# Patient Record
Sex: Female | Born: 1980
Health system: Southern US, Community
[De-identification: ages and names within clinical notes are randomized; demographics above are authoritative.]

---
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
## 2002-10-17 ENCOUNTER — Emergency Department (HOSPITAL_COMMUNITY): Admission: EM | Admit: 2002-10-17 | Discharge: 2002-10-18 | Payer: Self-pay | Admitting: Emergency Medicine

## 2002-10-18 ENCOUNTER — Encounter: Payer: Self-pay | Admitting: Emergency Medicine

## 2003-09-19 ENCOUNTER — Other Ambulatory Visit: Admission: RE | Admit: 2003-09-19 | Discharge: 2003-09-19 | Payer: Self-pay | Admitting: Obstetrics and Gynecology

## 2004-10-30 ENCOUNTER — Other Ambulatory Visit: Admission: RE | Admit: 2004-10-30 | Discharge: 2004-10-30 | Payer: Self-pay | Admitting: Obstetrics and Gynecology

## 2004-11-26 ENCOUNTER — Emergency Department (HOSPITAL_COMMUNITY): Admission: EM | Admit: 2004-11-26 | Discharge: 2004-11-27 | Payer: Self-pay | Admitting: *Deleted

## 2005-02-19 ENCOUNTER — Emergency Department (HOSPITAL_COMMUNITY): Admission: EM | Admit: 2005-02-19 | Discharge: 2005-02-19 | Payer: Self-pay | Admitting: Family Medicine

## 2005-03-05 ENCOUNTER — Emergency Department (HOSPITAL_COMMUNITY): Admission: EM | Admit: 2005-03-05 | Discharge: 2005-03-05 | Payer: Self-pay | Admitting: Emergency Medicine

## 2006-08-16 ENCOUNTER — Emergency Department (HOSPITAL_COMMUNITY): Admission: EM | Admit: 2006-08-16 | Discharge: 2006-08-16 | Payer: Self-pay | Admitting: Emergency Medicine

## 2007-02-19 ENCOUNTER — Emergency Department (HOSPITAL_COMMUNITY): Admission: EM | Admit: 2007-02-19 | Discharge: 2007-02-19 | Payer: Self-pay | Admitting: Emergency Medicine

## 2007-08-25 ENCOUNTER — Emergency Department (HOSPITAL_COMMUNITY): Admission: EM | Admit: 2007-08-25 | Discharge: 2007-08-26 | Payer: Self-pay | Admitting: Emergency Medicine

## 2007-08-28 ENCOUNTER — Inpatient Hospital Stay (HOSPITAL_COMMUNITY): Admission: AD | Admit: 2007-08-28 | Discharge: 2007-08-28 | Payer: Self-pay | Admitting: Obstetrics and Gynecology

## 2008-03-14 ENCOUNTER — Emergency Department (HOSPITAL_COMMUNITY): Admission: EM | Admit: 2008-03-14 | Discharge: 2008-03-14 | Payer: Self-pay | Admitting: Family Medicine

## 2009-08-14 ENCOUNTER — Inpatient Hospital Stay (HOSPITAL_COMMUNITY): Admission: AD | Admit: 2009-08-14 | Discharge: 2009-08-14 | Payer: Self-pay | Admitting: Obstetrics & Gynecology

## 2009-08-14 ENCOUNTER — Ambulatory Visit: Payer: Self-pay | Admitting: Nurse Practitioner

## 2009-08-21 ENCOUNTER — Emergency Department (HOSPITAL_COMMUNITY): Admission: EM | Admit: 2009-08-21 | Discharge: 2009-08-21 | Payer: Self-pay | Admitting: Family Medicine

## 2010-04-17 ENCOUNTER — Inpatient Hospital Stay (HOSPITAL_COMMUNITY)
Admission: AD | Admit: 2010-04-17 | Discharge: 2010-04-22 | DRG: 766 | Disposition: A | Discharge status: Home / Self Care | Payer: Medicaid Other | Source: Ambulatory Visit | Attending: Obstetrics and Gynecology | Admitting: Obstetrics and Gynecology

## 2010-04-17 DIAGNOSIS — O99892 Other specified diseases and conditions complicating childbirth: Secondary | ICD-10-CM | POA: Diagnosis present

## 2010-04-17 DIAGNOSIS — Z22330 Carrier of Group B streptococcus: Secondary | ICD-10-CM

## 2010-04-17 LAB — CBC
HCT: 35.7 % — ABNORMAL LOW (ref 36.0–46.0)
Hemoglobin: 12.1 g/dL (ref 12.0–15.0)
Platelets: 249 10*3/uL (ref 150–400)

## 2010-04-18 LAB — RPR: RPR Ser Ql: NONREACTIVE

## 2010-04-19 ENCOUNTER — Other Ambulatory Visit: Payer: Self-pay | Admitting: Obstetrics and Gynecology

## 2010-04-20 LAB — CBC: MCV: 64.6 fL — ABNORMAL LOW (ref 78.0–100.0)

## 2010-04-24 NOTE — Discharge Summary (Signed)
  Jodi Blair, Jodi Blair                 ACCOUNT NO.:  [id number]  MEDICAL RECORD NO.:  [id number]           PATIENT TYPE:  I  LOCATION:  9107                          [facility]AC[facility]:  [facility]  PHYSICIAN:  Sherron Monday, MD        DATE OF BIRTH:  11/21/1980  DATE OF ADMISSION:  04/18/2010 DATE OF DISCHARGE:  04/22/2010                              DISCHARGE SUMMARY   ADMITTING DIAGNOSIS:  Intrauterine pregnancy at 41+ weeks for induction of labor.  She is a G1, P0 with an EDC of April 09, 2010, by ultrasound admitted for induction.  She is dated by an early second trimester ultrasound. Her prenatal care was uncomplicated except for she was admitted the evening of the 6th for Cervidil for induction.  PAST MEDICAL HISTORY:  Significant for Chlamydia trach and condyloma as well as asthma.  PAST SURGICAL HISTORY:  Not significant.  PAST OBSTETRIC/GYNECOLOGICAL HISTORY:  G1 with present pregnancy.  MEDICATIONS:  Prenatal vitamins as well as clobetasol saline nasal spray, Tums, Benadryl, prenatal care, and iron.  ALLERGIES:  No known drug allergies.  SOCIAL HISTORY:  Denies alcohol, tobacco, or drug use.  FAMILY HISTORY:  Significant for HTN in her mother and father.  On the morning after admission, Cervidil was removed.  AROM was performed for clear fluid.  She progressed slowly in labor changing to approximately 2-3 cm.  This was unchanged over several hours with adequate contractions.  At this time, the discussion was made with the patient of failure to progress.  She voiced understanding and they proceeded.  She delivered a female infant weighing 8 pounds and 3 ounces with Apgars of 7 at 1 minute and 9 at 5 minutes.  Her postpartum care was relatively uncomplicated.  She remained afebrile.  Vital signs stable throughout.  Her hemoglobin decreased from 12.1-8.7.  She was discharged to home on postpartum day #3 with no complaints except her abdomen was sore and incision was itching.  She  tolerated p.o. well. She had normal lochia and was ambulating, voiding without difficulty. Her staples were removed and Steri-Strips were applied.  Incision was clean, dry, and intact.  She was discharged home with Motrin, iron, Percocet, and Phenergan at her request and prenatal vitamins with instructions to follow up in the office in approximately 2 weeks.  She voiced understanding of this and was discharged home.     Sherron Monday, MD     JB/MEDQ  D:  04/22/2010  T:  04/23/2010  Job:  [id number]  Electronically Signed by Sherron Monday MD on 04/24/2010 01:51:16 PM

## 2010-04-29 LAB — POCT PREGNANCY, URINE: Preg Test, Ur: POSITIVE

## 2010-04-30 NOTE — H&P (Signed)
Jodi Blair, Jodi Blair                 ACCOUNT NO.:  [id number]  MEDICAL RECORD NO.:  [id number]           PATIENT TYPE:  I  LOCATION:  9107                          [facility]  PHYSICIAN:  Malachi Pro. Ambrose Mantle, M.D. DATE OF BIRTH:  07-11-1980  DATE OF ADMISSION:  04/17/2010 DATE OF DISCHARGE:                             HISTORY & PHYSICAL   HISTORY OF PRESENT ILLNESS:  This is a 30 year old Asian female para 0, gravida 1, EDC April 09, 2010, by ultrasound admitted for induction of labor.  Blood group and type AB positive, negative antibody, nonreactive serology, rubella immune, hepatitis B surface antigen negative, HIV negative, GC and Chlamydia negative.  First trimester screen negative.  Cystic fibrosis negative, AFP negative, hemoglobin 8, 1-hour Glucola 101.  Group B strep was positive, repeat HIV and RPR nonreactive.  Ultrasound on October 10, 2009, estimated gestational age [redacted] weeks 1 day with [facility] of April 09, 2010.  Prenatal care was uncomplicated except for failure of the cervix to ripen.  Ultrasound on November 09, 2009, estimated gestational age [redacted] weeks 5 days, [facility] on April 07, 2010.  The patient was admitted on the evening of April 17, 2010, for Cervidil.  The cervix was long and closed.  Vertex at -4.  PAST MEDICAL HISTORY:  No known drug allergies.  No operations, illnesses.  Chlamydia, Trichomonas, and condylomata and asthma. Alcohol, tobacco, and drugs none.  FAMILY HISTORY:  Father and mother with high blood pressure.  PHYSICAL EXAMINATION:  VITAL SIGNS:  Physical exam on admission, normal. HEART:  Normal size and sounds.  No murmurs. LUNGS:  Clear to auscultation. ABDOMEN:  Soft.  Fundal height 38.5 cm on April 12, 2010.  Fetal heart tones were normal.  On the morning after admission, the Cervidil was removed at 7:40 a.m. The cervix was a fingertip, 40-50%, vertex at a -2 to -3 station. Artificial rupture of membranes produced clear fluid.  The patient  was begun on penicillin and Pitocin by 12:42 p.m., Pitocin was at 18 milliunits a minute.  Contractions were hard to trace, probably every 2- 3 minutes.  Cervix was a fingertip, 60-70%, vertex -2 to -3.  The patient was followed throughout the afternoon and evening of April 18, 2010.  She did reach 3 cm and 80% effaced at 8:10 p.m. and thereafter including the exam at 1:40 a.m. on April 19, 2010, the cervix had failed to change.  I advised a C-section.  The patient wanted to wait a few more hours, and I advised that there was no real rationale for waiting that the cervix had failed to change in spite of adequate doses of Pitocin, and after some consideration, the patient agreed to proceed with the C-section.  She is now prepared at 2:20 a.m. to move to the operating room when they have a room ready to have the C- section.  IMPRESSION:  Intrauterine pregnancy 41 plus weeks.  Failure to progress in labor.     Malachi Pro. Ambrose Mantle, M.D.     TFH/MEDQ  D:  04/19/2010  T:  04/19/2010  Job:  [id number]  Electronically Signed by  Tracey Harries M.D. on 04/30/2010 08:52:22 AM

## 2010-04-30 NOTE — Op Note (Signed)
Jodi Blair, Jodi Blair                 ACCOUNT NO.:  [id number]  MEDICAL RECORD NO.:  [id number]           PATIENT TYPE:  I  LOCATION:  9107                          [facility]  PHYSICIAN:  Malachi Pro. Ambrose Mantle, M.D. DATE OF BIRTH:  1981/01/25  DATE OF PROCEDURE:  04/19/2010 DATE OF DISCHARGE:                              OPERATIVE REPORT   PREOPERATIVE DIAGNOSIS:  Intrauterine pregnancy 41 weeks 3 days, failure to progress in labor.  POSTOPERATIVE DIAGNOSIS:  Intrauterine pregnancy 41 weeks 3 days, failure to progress in labor..  OPERATION:  Low-transverse cervical cesarean section.  OPERATOR:  Malachi Pro. Ambrose Mantle, MD  ANESTHESIA:  Epidural anesthesia for Labor and Delivery and attempted epidural for the C-section, but this had to be converted to a spinal anesthetic.  The patient was brought to the operating room for failure to progress in labor.  The epidural was boosted and the patient was positioned.  The abdomen was prepped with Betadine solution.  A Foley catheter was already indwelling.  We pinched the lower abdomen the patient could feel it, so the anesthesiologist and the anesthetist gave additional doses of the anesthetic, but still the patient was not anesthetized especially on the left side, so Dr. Malen Gauze decided to abandon that anesthetic and do a spinal so the patient's drapes were removed.  She sat up and Dr. Malen Gauze placed a spinal anesthetic.  She was then placed back on the operating table.  The abdomen was prepped again, draped again and the patient was quite nauseated and vomiting quite a bit, so Dr. Malen Gauze felt that we did not even need to check for anesthesia that she had a high level.  We made a transverse incision through the skin and subcutaneous tissue and fascia.  The fascia was then separated from the rectus muscles superiorly and inferiorly.  Rectus muscle was split in the midline. Peritoneum was opened vertically.  I placed the Alexis retractor  into the abdominal cavity and then exposed the lower uterine segment with it, made a transverse incision through the superficial layers of the myometrium, then went the rest away with my finger, pulled superiorly and inferiorly to enlarge the incision.  We did have a loose nuchal cord.  I then delivered the head, suctioned the nose and mouth to deliver the rest of the baby, gave the baby to the waiting neonatologist.  They assigned the 8 pound 3 ounce female infant Apgars of 7 at one and 9 at five minutes.  After I identified any bleeders on the uterine incision, I drew the cord blood.  The uterus was quite boggy.  I removed the placenta and pulled the uterus out of the abdominal cavity and manually massaged it with IV Pitocin running to try to get the uterus some tone.  It was an extremely boggy uterus.  The bleeding then diminished.  I then closed the uterine incision with 2 running sutures of 0 Vicryl, locking the first layer, nonlocking on the second layer.  I liberally irrigated the uterine incision and the gutters, suctioned all material away, inspected both gutters they were free of any blood and  debris, inspected the uterus, tubes and ovaries. They were normal.  The uterine incision was not bleeding, so the Alexis retractor was removed.  The abdominal wall was closed with interrupted sutures of 0 Vicryl on the rectus muscle and the peritoneum in one layer.  The fascia was closed with 2 running sutures of 0 Vicryl subcutaneous with a running 3-0 Vicryl and the skin was closed with automatic staples.  The patient seemed to tolerate the procedure well.  Blood loss was about 1000 mL.  Sponge and needle counts were correct and she was returned to recovery in satisfactory condition.     Malachi Pro. Ambrose Mantle, M.D.     TFH/MEDQ  D:  04/19/2010  T:  04/19/2010  Job:  [id number]  Electronically Signed by Tracey Harries M.D. on 04/30/2010 08:52:35 AM

## 2010-11-09 LAB — POCT PREGNANCY, URINE
Operator id: 257131
Preg Test, Ur: NEGATIVE

## 2010-11-27 LAB — POCT URINALYSIS DIP (DEVICE)
Operator id: 270961
Protein, ur: 30 — AB
Urobilinogen, UA: 0.2

## 2010-11-27 LAB — POCT PREGNANCY, URINE: Operator id: 270961

## 2012-08-05 ENCOUNTER — Encounter (HOSPITAL_COMMUNITY): Payer: Self-pay | Admitting: Family Medicine

## 2012-08-05 ENCOUNTER — Emergency Department (HOSPITAL_COMMUNITY): Payer: Self-pay

## 2012-08-05 ENCOUNTER — Emergency Department (HOSPITAL_COMMUNITY)
Admission: EM | Admit: 2012-08-05 | Discharge: 2012-08-05 | Disposition: A | Discharge status: Home / Self Care | Payer: Self-pay | Attending: Emergency Medicine | Admitting: Emergency Medicine

## 2012-08-05 DIAGNOSIS — Z87891 Personal history of nicotine dependence: Secondary | ICD-10-CM | POA: Insufficient documentation

## 2012-08-05 DIAGNOSIS — Z79899 Other long term (current) drug therapy: Secondary | ICD-10-CM | POA: Insufficient documentation

## 2012-08-05 DIAGNOSIS — R091 Pleurisy: Secondary | ICD-10-CM | POA: Insufficient documentation

## 2012-08-05 MED ORDER — IBUPROFEN 800 MG PO TABS
800.0000 mg | ORAL_TABLET | Freq: Once | ORAL | Status: AC
  Administered 2012-08-05: 800 mg via ORAL
  Filled 2012-08-05: qty 1

## 2012-08-05 MED ORDER — IBUPROFEN 800 MG PO TABS: 800.0000 mg | ORAL_TABLET | Freq: Three times a day (TID) | ORAL | Status: AC

## 2012-08-05 NOTE — ED Provider Notes (Signed)
History    CSN: [id number] Arrival date & time 08/05/12  0123  First MD Initiated Contact with Patient 08/05/12 0204     Chief Complaint  Patient presents with  . Chest Pain   (Consider location/radiation/quality/duration/timing/severity/associated sxs/prior Treatment) HPI Hx per patient - left sided pain "under my ribs" hurts to take a deep breath.  PT has been lifting weights recently and is worried a pulled muscle.  She denies any cough, fever or SOB. Pain is harp in quality, mod in severity and not radiating. No h/o same. No leg pain or swelling. Non smoker. No BCPs.    History reviewed. No pertinent past medical history. Past Surgical History  Procedure Laterality Date  . Cesarean section     No family history on file. History  Substance Use Topics  . Smoking status: Former Smoker    Quit date: 06/11/2005  . Smokeless tobacco: Not on file  . Alcohol Use: No   OB History   Grav Para Term Preterm Abortions TAB SAB Ect Mult Living                 Review of Systems  Constitutional: Negative for fever and chills.  HENT: Negative for neck pain and neck stiffness.   Eyes: Negative for pain.  Respiratory: Negative for shortness of breath.   Cardiovascular: Positive for chest pain.  Gastrointestinal: Negative for abdominal pain.  Genitourinary: Negative for dysuria.  Musculoskeletal: Negative for back pain.  Skin: Negative for rash.  Neurological: Negative for headaches.  All other systems reviewed and are negative.    Allergies  Review of patient's allergies indicates no known allergies.  Home Medications   Current Outpatient Rx  Name  Route  Sig  Dispense  Refill  . CREATINE PO   Oral   Take 1 tablet by mouth daily.         Marland Kitchen ibuprofen (ADVIL,MOTRIN) 200 MG tablet   Oral   Take 800 mg by mouth every 6 (six) hours as needed for pain.         . Multiple Vitamin (MULTIVITAMIN WITH MINERALS) TABS   Oral   Take 1 tablet by mouth daily.         .  NON FORMULARY   Oral   Take 1 packet by mouth daily. OTC. C4 Supplement         . ibuprofen (ADVIL,MOTRIN) 800 MG tablet   Oral   Take 1 tablet (800 mg total) by mouth 3 (three) times daily.   21 tablet   0    BP 126/79  Pulse 79  Temp(Src) 98.4 F (36.9 C) (Oral)  Resp 18  SpO2 99%  LMP 07/31/2012 Physical Exam  Constitutional: She is oriented to person, place, and time. She appears well-developed and well-nourished.  HENT:  Head: Normocephalic and atraumatic.  Eyes: EOM are normal. Pupils are equal, round, and reactive to light.  Neck: Neck supple.  Cardiovascular: Normal rate, regular rhythm and intact distal pulses.   Pulmonary/Chest: Effort normal and breath sounds normal. No stridor. No respiratory distress.  Reproducible point tenderness left anterior lower chest wall. No crepitus. No rash  Abdominal: Soft. Bowel sounds are normal. She exhibits no distension. There is no tenderness.  Musculoskeletal: Normal range of motion. She exhibits no edema.  Neurological: She is alert and oriented to person, place, and time.  Skin: Skin is warm and dry.    ED Course  Procedures (including critical care time) Labs Reviewed - No data to  display Dg Chest 2 View  08/05/2012   *RADIOLOGY REPORT*  Clinical Data: Chest pain.  CHEST - 2 VIEW  Comparison: None.  Findings: Heart and mediastinal contours are within normal limits. No focal opacities or effusions.  No acute bony abnormality.  IMPRESSION: No active cardiopulmonary disease.   Original Report Authenticated By: Charlett Nose, M.D.   1. Pleurisy     Date: 08/05/2012  Rate: 54  Rhythm: sinus bradycardia  QRS Axis: normal  Intervals: normal  ST/T Wave abnormalities: nonspecific ST changes  Conduction Disutrbances:none  Narrative Interpretation:   Old EKG Reviewed: none available  Motrin PO  Chest wall pain precautions provided with Rx. Stable for d/c home.  MDM  Chest wall pain reproducible tenderness  EKG. Chest  x-ray.  Medication provided.  Vital signs and nursing notes reviewed and considered  Sunnie Nielsen, MD 08/05/12 [phone]

## 2012-08-05 NOTE — ED Notes (Signed)
Patient states that she has had pain under her left breast for the past 3 days. States pain has gotten worse. States that she has been lifting weights and unsure if she has pulled something. Took Motrin 800 mg at 10pm. Pain worse with inspiration, coughing and sneezing.

## 2012-08-05 NOTE — ED Notes (Signed)
Patient transported to X-ray 

## 2015-07-04 ENCOUNTER — Ambulatory Visit (INDEPENDENT_AMBULATORY_CARE_PROVIDER_SITE_OTHER): Payer: BLUE CROSS/BLUE SHIELD | Admitting: Family Medicine

## 2015-07-04 ENCOUNTER — Ambulatory Visit (INDEPENDENT_AMBULATORY_CARE_PROVIDER_SITE_OTHER): Payer: BLUE CROSS/BLUE SHIELD

## 2015-07-04 VITALS — BP 106/74 | HR 84 | Temp 99.2°F | Resp 18 | Wt 178.0 lb

## 2015-07-04 DIAGNOSIS — M25571 Pain in right ankle and joints of right foot: Secondary | ICD-10-CM | POA: Diagnosis not present

## 2015-07-04 DIAGNOSIS — S93401A Sprain of unspecified ligament of right ankle, initial encounter: Secondary | ICD-10-CM | POA: Diagnosis not present

## 2015-07-04 NOTE — Progress Notes (Signed)
   Subjective:    Patient ID: Jodi Blair, female    DOB: 10-31-1980, 35 y.o.   MRN: [id number]  HPI Comments: She reports rolling her right ankle last Saturday. She "hopped" to the bed where she stayed for next 1.5 days. Has taken Naprosyn for pain. She has been able to ambulate with pain since.She has had continued pain, swelling and bruising so she comes in today for evaluation. Denies previous ankle fractures / surgeries.    Ankle Pain  The incident occurred 5 to 7 days ago. The incident occurred at home. The injury mechanism was an inversion injury. The pain is present in the right ankle. The quality of the pain is described as stabbing. The pain is moderate. The pain has been constant since onset. Associated symptoms include an inability to bear weight. The symptoms are aggravated by weight bearing, palpation and movement. She has tried ice and NSAIDs for the symptoms. The treatment provided mild relief.    Review of Systems  All other systems reviewed and are negative.      Objective:   Physical Exam  Constitutional: She appears well-developed and well-nourished.  Cardiovascular: Normal rate and regular rhythm.   Pulmonary/Chest: Effort normal and breath sounds normal.  Musculoskeletal:  Right Ankle & Foot: Inspection:  Mod swelling over lateral ankle and foot, diffuse ecchymosis over lateral ankle and dorsum of foot,   Palpation:  No pain at base of 5th MT; ; No tenderness over N spot or navicular prominence Tenderness at posterior aspect of lateral malleolus  ROM: Limited by pain in eversion  Strength: 4/5 ankle plantar flexion  Sensation: intact Vascular: intact w/ dorsalis pedis  Increased laxity in lateral ligaments with solid end points; Negative Anterior drawer test No high ankle sprain: Negative squeeze test & no pain w/ dorsiflexion or eversion  Able to walk with limp    Nursing note and vitals reviewed.     Assessment & Plan:   Moderate right ankle  sprain Grade 2 right ankle sprain. DG negative for fractures.  - provided aircast for comfort / protection - Instructed on HEP - return prn if not improving

## 2015-07-04 NOTE — Patient Instructions (Addendum)
Wear you aircast as needed for the next 1-2 week when you are being active to prevent re-injuring your ankle.  - Ice 15-20 as needed for pain / swelling - Do Ankle ABC's 2-3 times a day to maintain your range of motion.  - Follow instructions on DVD to do exercises with theraband to strength your ankle and reduce risk of future ankle injuries.    IF you received an x-ray today, you will receive an invoice from [facility]. Please contact [facility] at [phone] with questions or concerns regarding your invoice.   IF you received labwork today, you will receive an invoice from [facility]. Please contact [facility] at [phone] with questions or concerns regarding your invoice.   Our billing staff will not be able to assist you with questions regarding bills from these companies.  You will be contacted with the lab results as soon as they are available. The fastest way to get your results is to activate your My Chart account. Instructions are located on the last page of this paperwork. If you have not heard from us regarding the results in 2 weeks, please contact this office.

## 2015-07-04 NOTE — Assessment & Plan Note (Signed)
Grade 2 right ankle sprain. DG negative for fractures.  - provided aircast for comfort / protection - Instructed on HEP - return prn if not improving

## 2016-06-27 ENCOUNTER — Ambulatory Visit (HOSPITAL_COMMUNITY)
Admission: EM | Admit: 2016-06-27 | Discharge: 2016-06-27 | Disposition: A | Discharge status: Home / Self Care | Payer: BLUE CROSS/BLUE SHIELD | Attending: Family Medicine | Admitting: Family Medicine

## 2016-06-27 ENCOUNTER — Encounter (HOSPITAL_COMMUNITY): Payer: Self-pay | Admitting: Emergency Medicine

## 2016-06-27 DIAGNOSIS — R0789 Other chest pain: Secondary | ICD-10-CM | POA: Diagnosis not present

## 2016-06-27 DIAGNOSIS — Z720 Tobacco use: Secondary | ICD-10-CM

## 2016-06-27 MED ORDER — NAPROXEN 250 MG PO TABS: 250.0000 mg | ORAL_TABLET | Freq: Two times a day (BID) | ORAL | 0 refills | Status: AC

## 2016-06-27 NOTE — ED Provider Notes (Signed)
CSN: [id number]     Arrival date & time 06/27/16  1808 History   None    Chief Complaint  Patient presents with  . Chest Pain   (Consider location/radiation/quality/duration/timing/severity/associated sxs/prior Treatment) Patient c/o chest wall pain.  She has discomfort more when taking a deep breath.  She started smoking again.   The history is provided by the patient.  Chest Pain  Pain location:  L chest Pain quality: aching   Pain radiates to:  Does not radiate Pain severity:  Mild Onset quality:  Sudden Duration:  1 day Timing:  Intermittent Chronicity:  New Relieved by:  Nothing Worsened by:  Nothing   History reviewed. No pertinent past medical history. Past Surgical History:  Procedure Laterality Date  . CESAREAN SECTION     No family history on file. Social History  Substance Use Topics  . Smoking status: Current Every Day Smoker    Last attempt to quit: 06/11/2005  . Smokeless tobacco: Not on file  . Alcohol use No   OB History    No data available     Review of Systems  Constitutional: Negative.   HENT: Negative.   Eyes: Negative.   Respiratory: Negative.   Cardiovascular: Positive for chest pain.  Gastrointestinal: Negative.   Endocrine: Negative.   Genitourinary: Negative.   Musculoskeletal: Positive for arthralgias.  Allergic/Immunologic: Negative.   Hematological: Negative.   Psychiatric/Behavioral: Negative.     Allergies  Patient has no known allergies.  Home Medications   Prior to Admission medications   Medication Sig Start Date End Date Taking? Authorizing Provider  cetirizine (ZYRTEC) 10 MG chewable tablet Chew 10 mg by mouth daily.   Yes [provider]  diphenhydrAMINE (BENADRYL) 25 mg capsule Take 25 mg by mouth every 6 (six) hours as needed.   Yes [provider]  CREATINE PO Take 1 tablet by mouth daily. Reported on 07/04/2015    [provider]  ibuprofen (ADVIL,MOTRIN) 200 MG tablet Take 800 mg by  mouth every 6 (six) hours as needed for pain.    [provider]  ibuprofen (ADVIL,MOTRIN) 800 MG tablet Take 1 tablet (800 mg total) by mouth 3 (three) times daily. Patient not taking: Reported on 07/04/2015 08/05/12   Sunnie Nielsenpitz, Brian, MD  Multiple Vitamin (MULTIVITAMIN WITH MINERALS) TABS Take 1 tablet by mouth daily.    [provider]  naproxen (NAPROSYN) 250 MG tablet Take 1 tablet (250 mg total) by mouth 2 (two) times daily with a meal. 06/27/16   Mikelle Myrick, Anselm PancoastWilliam J, FNP  NON FORMULARY Take 1 packet by mouth daily. OTC. C4 Supplement    [provider]   Meds Ordered and Administered this Visit  Medications - No data to display  BP (!) 129/58 (BP Location: Right Arm) Comment: notified  Pulse 83   Temp 98.5 F (36.9 C) (Oral)   Resp 14   LMP 06/10/2016   SpO2 100%  No data found.   Physical Exam  Constitutional: She appears well-developed and well-nourished.  HENT:  Head: Normocephalic and atraumatic.  Eyes: Conjunctivae and EOM are normal. Pupils are equal, round, and reactive to light.  Neck: Normal range of motion. Neck supple.  Cardiovascular: Normal rate, regular rhythm and normal heart sounds.   Pulmonary/Chest: Effort normal and breath sounds normal.  Musculoskeletal: She exhibits tenderness.  TTP left anterior chest wall.  Nursing note and vitals reviewed.   Urgent Care Course     Procedures (including critical care time)  Labs Review  Labs Reviewed - No data to display  Imaging Review No results found.   Visual Acuity Review  Right Eye Distance:   Left Eye Distance:   Bilateral Distance:    Right Eye Near:   Left Eye Near:    Bilateral Near:         MDM   1. Chest wall pain    Explained need to quit smoking  Naprosyn 500mg  one po bid x 7 days #14  EKG wnl      Deatra Canter, FNP 06/27/16 1952

## 2016-06-27 NOTE — ED Triage Notes (Signed)
chest pain for 1-2 weeks.  Points to left chest as location of pain, pain is intermittent.  Patient recently started smoking cigarettes again.  Denies cough, cold or runny nose.  Tight and uncomfortable

## 2017-03-26 IMAGING — CR DG ANKLE COMPLETE 3+V*R*
4 series · 4 of 4 positions shown · non-contrast
Comparison: None in PACs

CLINICAL DATA: Right ankle injury 10 days ago

EXAM:
RIGHT ANKLE - COMPLETE 3+ VIEW

[AP]
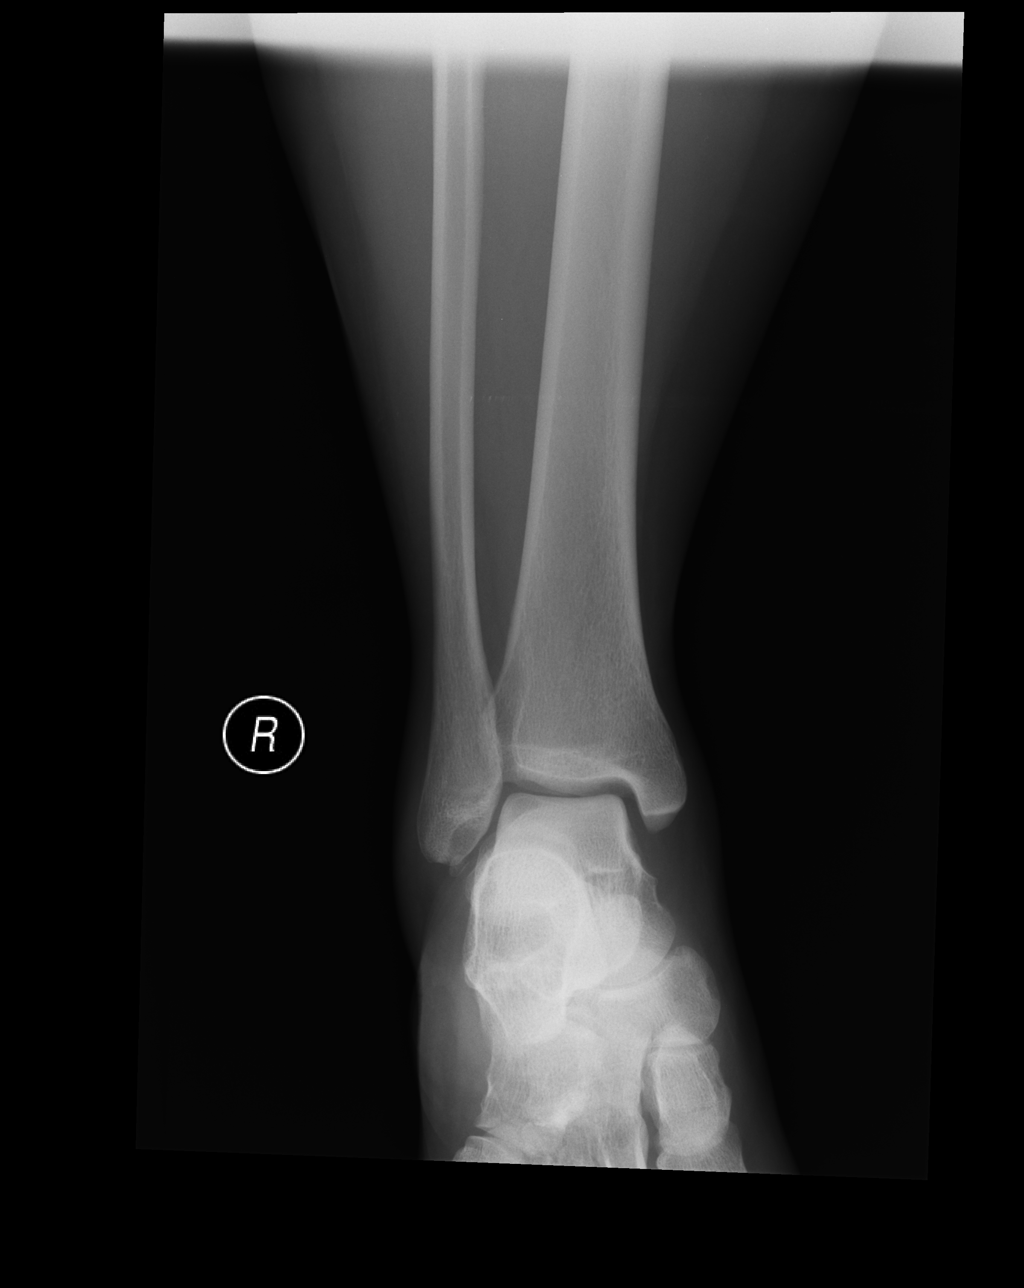

[ap obl int rot (1 of 2)]
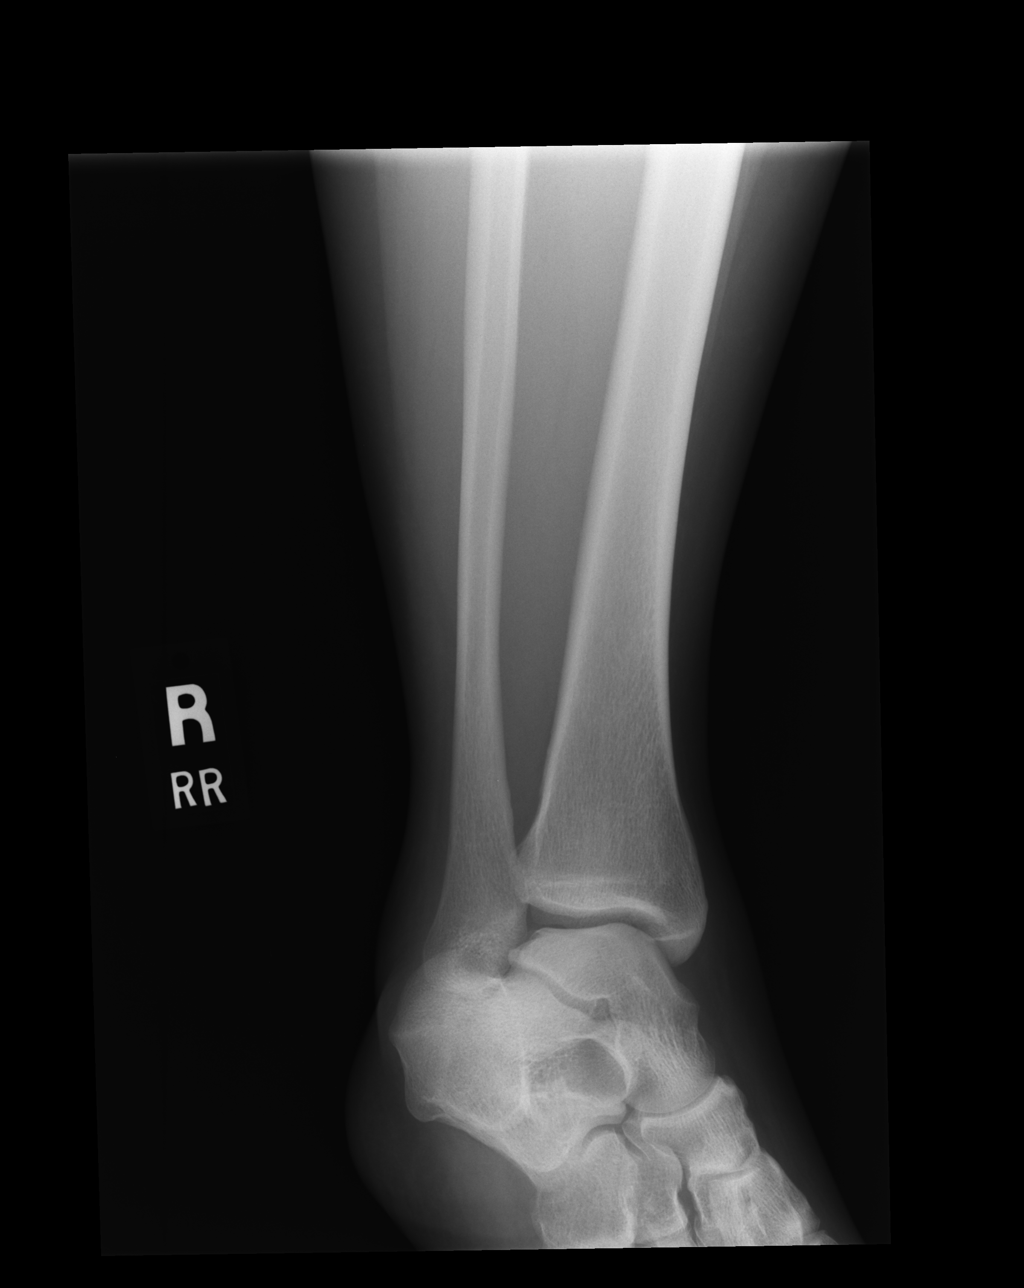

[ap obl int rot (2 of 2)]
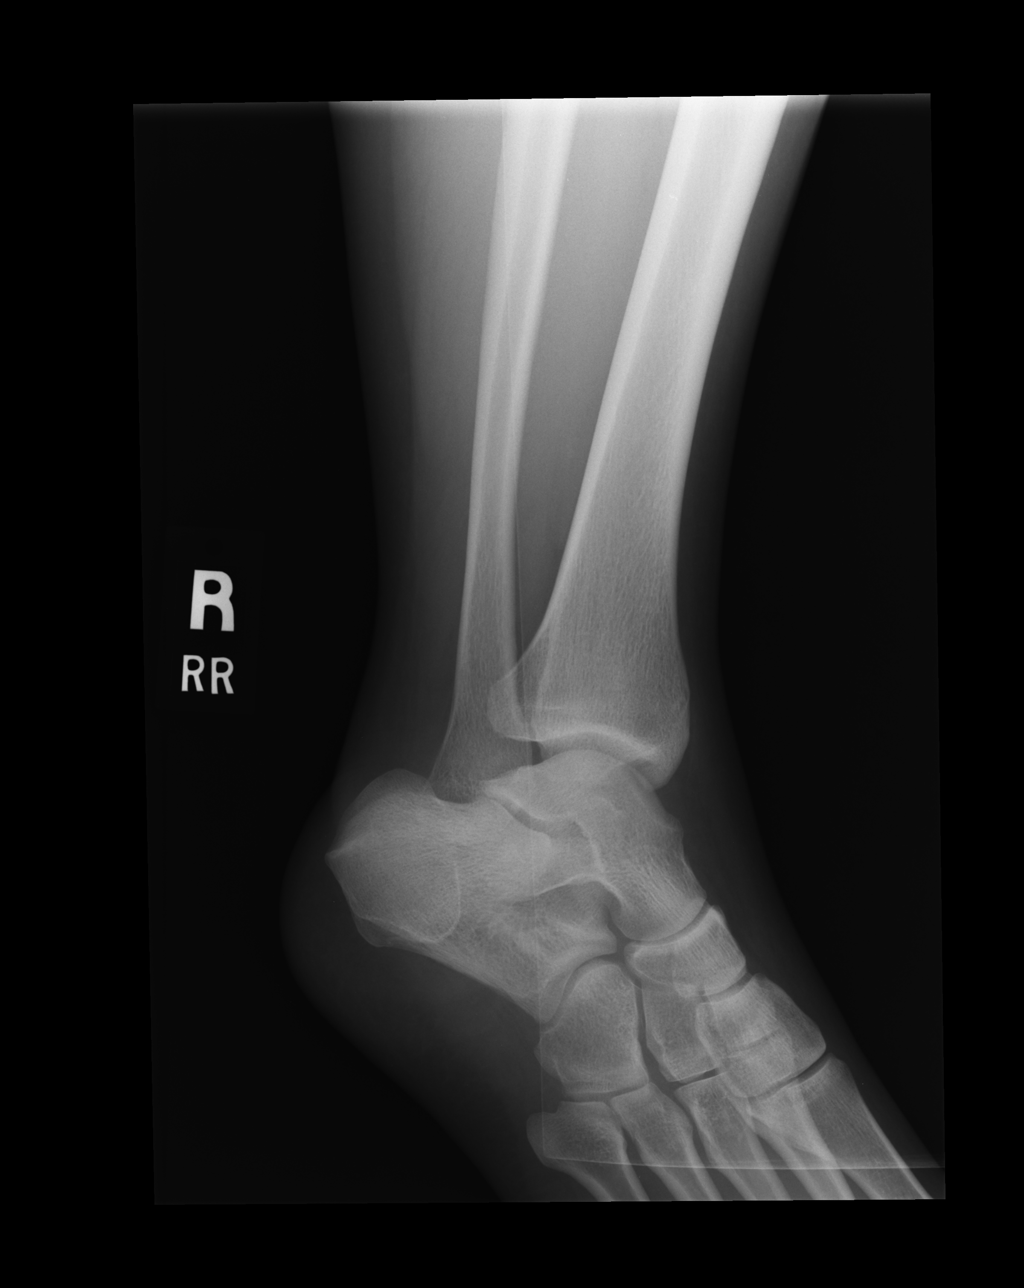

[lateral]
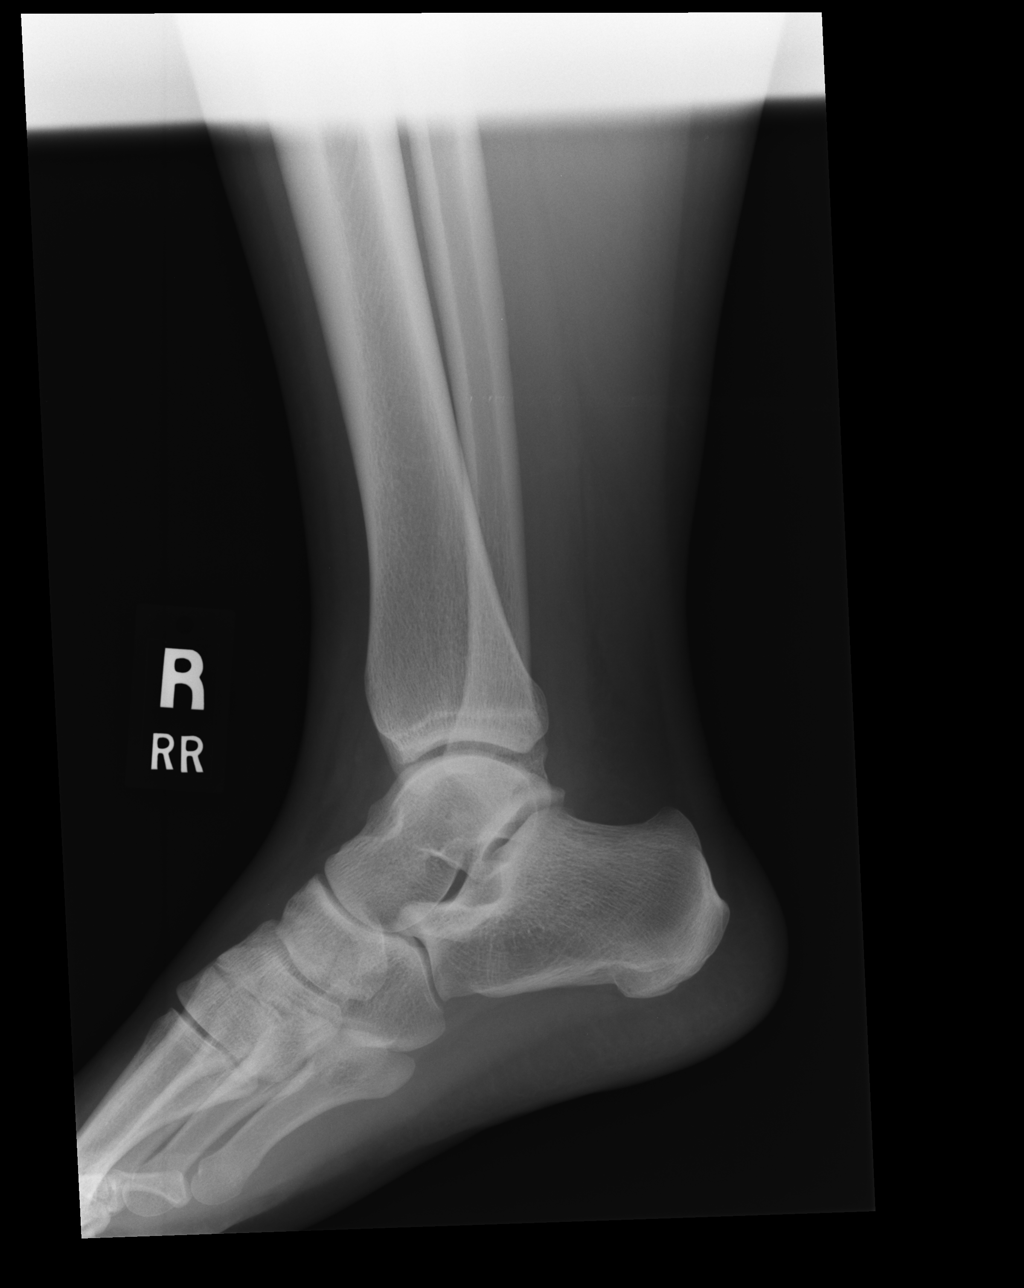

[4 of 4 positions shown; findings below may reference images not displayed]

FINDINGS: The bones are adequately mineralized. The ankle joint mortise is
preserved. The talar dome is intact. No acute malleolar fracture is
observed. There is an accessory ossicle lying inferior to the tip of
the lateral malleolus. The talus and calcaneus are intact. There is
soft tissue swelling laterally.
IMPRESSION: There is mild soft tissue swelling overlying the lateral malleolus.
No underlying fracture nor dislocation is observed.

## 2019-02-13 DIAGNOSIS — Z20828 Contact with and (suspected) exposure to other viral communicable diseases: Secondary | ICD-10-CM | POA: Diagnosis not present

## 2019-03-31 DIAGNOSIS — Z716 Tobacco abuse counseling: Secondary | ICD-10-CM | POA: Diagnosis not present

## 2019-05-07 DIAGNOSIS — Z23 Encounter for immunization: Secondary | ICD-10-CM | POA: Diagnosis not present

## 2019-05-28 DIAGNOSIS — Z23 Encounter for immunization: Secondary | ICD-10-CM | POA: Diagnosis not present

## 2019-10-11 DIAGNOSIS — F17200 Nicotine dependence, unspecified, uncomplicated: Secondary | ICD-10-CM | POA: Diagnosis not present

## 2019-10-11 DIAGNOSIS — Z720 Tobacco use: Secondary | ICD-10-CM | POA: Diagnosis not present

## 2019-10-27 DIAGNOSIS — Z3040 Encounter for surveillance of contraceptives, unspecified: Secondary | ICD-10-CM | POA: Diagnosis not present

## 2019-10-27 DIAGNOSIS — R350 Frequency of micturition: Secondary | ICD-10-CM | POA: Diagnosis not present

## 2019-10-27 DIAGNOSIS — Z3042 Encounter for surveillance of injectable contraceptive: Secondary | ICD-10-CM | POA: Diagnosis not present

## 2019-10-27 DIAGNOSIS — Z6831 Body mass index (BMI) 31.0-31.9, adult: Secondary | ICD-10-CM | POA: Diagnosis not present

## 2019-10-27 DIAGNOSIS — Z01419 Encounter for gynecological examination (general) (routine) without abnormal findings: Secondary | ICD-10-CM | POA: Diagnosis not present

## 2019-10-27 DIAGNOSIS — Z113 Encounter for screening for infections with a predominantly sexual mode of transmission: Secondary | ICD-10-CM | POA: Diagnosis not present

## 2019-10-28 DIAGNOSIS — Z01419 Encounter for gynecological examination (general) (routine) without abnormal findings: Secondary | ICD-10-CM | POA: Diagnosis not present

## 2019-11-03 DIAGNOSIS — U071 COVID-19: Secondary | ICD-10-CM | POA: Diagnosis not present

## 2019-11-03 DIAGNOSIS — Z20822 Contact with and (suspected) exposure to covid-19: Secondary | ICD-10-CM | POA: Diagnosis not present

## 2020-03-03 DIAGNOSIS — Z03818 Encounter for observation for suspected exposure to other biological agents ruled out: Secondary | ICD-10-CM | POA: Diagnosis not present

## 2020-03-03 DIAGNOSIS — Z20822 Contact with and (suspected) exposure to covid-19: Secondary | ICD-10-CM | POA: Diagnosis not present

## 2020-06-06 DIAGNOSIS — R0602 Shortness of breath: Secondary | ICD-10-CM | POA: Diagnosis not present

## 2020-06-06 DIAGNOSIS — L309 Dermatitis, unspecified: Secondary | ICD-10-CM | POA: Diagnosis not present

## 2020-06-06 DIAGNOSIS — Z0000 Encounter for general adult medical examination without abnormal findings: Secondary | ICD-10-CM | POA: Diagnosis not present

## 2020-06-06 DIAGNOSIS — F17200 Nicotine dependence, unspecified, uncomplicated: Secondary | ICD-10-CM | POA: Diagnosis not present

## 2020-06-06 DIAGNOSIS — Z13220 Encounter for screening for lipoid disorders: Secondary | ICD-10-CM | POA: Diagnosis not present

## 2020-08-04 DIAGNOSIS — F17200 Nicotine dependence, unspecified, uncomplicated: Secondary | ICD-10-CM | POA: Diagnosis not present

## 2021-03-31 DIAGNOSIS — R0602 Shortness of breath: Secondary | ICD-10-CM | POA: Diagnosis not present

## 2021-03-31 DIAGNOSIS — M79605 Pain in left leg: Secondary | ICD-10-CM | POA: Diagnosis not present

## 2021-04-06 ENCOUNTER — Encounter (HOSPITAL_COMMUNITY): Payer: Self-pay | Admitting: Emergency Medicine

## 2021-04-06 ENCOUNTER — Emergency Department (HOSPITAL_COMMUNITY): Admission: RE | Admit: 2021-04-06 | Payer: Self-pay | Source: Ambulatory Visit

## 2021-04-06 ENCOUNTER — Other Ambulatory Visit: Payer: Self-pay

## 2021-04-06 ENCOUNTER — Emergency Department (HOSPITAL_COMMUNITY)
Admission: EM | Admit: 2021-04-06 | Discharge: 2021-04-06 | Disposition: A | Discharge status: Home / Self Care | Payer: Self-pay | Attending: Emergency Medicine | Admitting: Emergency Medicine

## 2021-04-06 DIAGNOSIS — Z79899 Other long term (current) drug therapy: Secondary | ICD-10-CM | POA: Insufficient documentation

## 2021-04-06 DIAGNOSIS — M79604 Pain in right leg: Secondary | ICD-10-CM | POA: Insufficient documentation

## 2021-04-06 DIAGNOSIS — Z87891 Personal history of nicotine dependence: Secondary | ICD-10-CM | POA: Insufficient documentation

## 2021-04-06 DIAGNOSIS — E876 Hypokalemia: Secondary | ICD-10-CM | POA: Insufficient documentation

## 2021-04-06 LAB — CBC WITH DIFFERENTIAL/PLATELET
Abs Immature Granulocytes: 0.03 10*3/uL (ref 0.00–0.07)
Basophils Absolute: 0 10*3/uL (ref 0.0–0.1)
Basophils Relative: 0 %
Eosinophils Absolute: 0.2 10*3/uL (ref 0.0–0.5)
Eosinophils Relative: 2 %
HCT: 28.5 % — ABNORMAL LOW (ref 36.0–46.0)
Hemoglobin: 9.6 g/dL — ABNORMAL LOW (ref 12.0–15.0)
Immature Granulocytes: 0 %
Lymphocytes Relative: 24 %
Lymphs Abs: 2.5 10*3/uL (ref 0.7–4.0)
MCH: 21.1 pg — ABNORMAL LOW (ref 26.0–34.0)
MCHC: 33.7 g/dL (ref 30.0–36.0)
MCV: 62.6 fL — ABNORMAL LOW (ref 80.0–100.0)
Monocytes Absolute: 1 10*3/uL (ref 0.1–1.0)
Monocytes Relative: 9 %
Neutro Abs: 6.6 10*3/uL (ref 1.7–7.7)
Neutrophils Relative %: 65 %
Platelets: 244 10*3/uL (ref 150–400)
RBC: 4.55 MIL/uL (ref 3.87–5.11)
RDW: 14.9 % (ref 11.5–15.5)
Smear Review: ADEQUATE
WBC: 10.3 10*3/uL (ref 4.0–10.5)
nRBC: 0 % (ref 0.0–0.2)

## 2021-04-06 LAB — BASIC METABOLIC PANEL
Anion gap: 8 (ref 5–15)
BUN: 5 mg/dL — ABNORMAL LOW (ref 6–20)
CO2: 26 mmol/L (ref 22–32)
Calcium: 8.4 mg/dL — ABNORMAL LOW (ref 8.9–10.3)
Chloride: 103 mmol/L (ref 98–111)
Creatinine, Ser: 0.9 mg/dL (ref 0.44–1.00)
GFR, Estimated: >60 mL/min (ref >60)
Glucose, Bld: 108 mg/dL — ABNORMAL HIGH (ref 70–99)
Potassium: 3.4 mmol/L — ABNORMAL LOW (ref 3.5–5.1)
Sodium: 137 mmol/L (ref 135–145)

## 2021-04-06 LAB — D-DIMER, QUANTITATIVE: D-Dimer, Quant: 0.36 ug/mL-FEU (ref 0.00–0.50)

## 2021-04-06 LAB — I-STAT BETA HCG BLOOD, ED (MC, WL, AP ONLY): I-stat hCG, quantitative: <5 m[IU]/mL (ref <5)

## 2021-04-06 NOTE — Discharge Instructions (Addendum)
IMPORTANT PATIENT INSTRUCTIONS:  You have been scheduled for an Outpatient Vascular Study at [facility].    If tomorrow is a Saturday, Sunday or holiday, please go to the [facility] Registration Desk at 11 am tomorrow morning and tell them you are there for a vascular study.   If tomorrow is a weekday (Monday-Friday), please go to [facility] Registration at 11 am and tell them you are there for a vascular study. 

## 2021-04-06 NOTE — ED Notes (Signed)
Pt reported to ED with c/o pain, swelling and warmth to touch on left lower extremity. Is concerned for possible DVT. Pt has recently had abdominal surgery.

## 2021-04-06 NOTE — ED Provider Triage Note (Signed)
Emergency Medicine Provider Triage Evaluation Note  Jodi Blair , a 41 y.o. female  was evaluated in triage.  Pt complains of Left lower extremity pain.  She had surgical procedure Wednesday last week, liposuction and tummy tuck.  Started having pain to her posterior calf and behind her knee, its been intermittent until today but now is constant feels a burning pain.  No chest pain or shortness of breath.  Has not smoked cigarettes in 60 days, not on oral birth control, no history of blood clots..  Review of Systems  Per HPI  Physical Exam  BP 134/79    Pulse 93    Temp 98.5 F (36.9 C) (Oral)    Resp 16    SpO2 97%  Gen:   Awake, no distress   Resp:  Normal effort  MSK:   Moves extremities without difficulty  Other:  DP and PT 2+.  Brisk cap refill, good range of motion to knee and ankle and hip.  Medical Decision Making  Medically screening exam initiated at 2:08 AM.  Appropriate orders placed.  Jodi Blair was informed that the remainder of the evaluation will be completed by another provider, this initial triage assessment does not replace that evaluation, and the importance of remaining in the ED until their evaluation is complete.  DVT    Theron Arista, [place] 04/06/21 9833

## 2021-04-06 NOTE — ED Provider Notes (Signed)
[facility] EMERGENCY DEPARTMENT Provider Note   CSN: [id number] Arrival date & time: 04/06/21  0109     History  Chief Complaint  Patient presents with   Leg Pain    Jodi Blair is a 41 y.o. female.  The history is provided by the patient and medical records.  Leg Pain  Patient with no documented comorbidities presents due to left lower extremity pain and swelling.  Patient says she has been having this Pain intermittently, was seen in urgent care 03/31/2021.  Was given naproxen which somewhat improved pain until today.  Pain became constant, feels like a burning sensation.  It is behind her knee with radiation up to her thigh.  Feels like her lower extremity veins are bulging.  No chest pain, shortness of breath, prior PEs or DVTs, OCP, h/o CA, pitting edema, paralysis or immobilization  of DVT.   Social: history of tobacco dependence (no cigarettes in 60 days)  Surgical hx - recent liposuction procedure last week ago, C-section  Home Medications Prior to Admission medications   Medication Sig Start Date End Date Taking? Authorizing Provider  cetirizine (ZYRTEC) 10 MG chewable tablet Chew 10 mg by mouth daily.    [provider]  CREATINE PO Take 1 tablet by mouth daily. Reported on 07/04/2015    [provider]  diphenhydrAMINE (BENADRYL) 25 mg capsule Take 25 mg by mouth every 6 (six) hours as needed.    [provider]  ibuprofen (ADVIL,MOTRIN) 200 MG tablet Take 800 mg by mouth every 6 (six) hours as needed for pain.    [provider]  ibuprofen (ADVIL,MOTRIN) 800 MG tablet Take 1 tablet (800 mg total) by mouth 3 (three) times daily. Patient not taking: Reported on 07/04/2015 08/05/12   Sunnie Nielsen, MD  Multiple Vitamin (MULTIVITAMIN WITH MINERALS) TABS Take 1 tablet by mouth daily.    [provider]  naproxen (NAPROSYN) 250 MG tablet Take 1 tablet (250 mg total) by mouth 2 (two) times daily with a meal. 06/27/16    Oxford, Anselm Pancoast, FNP  NON FORMULARY Take 1 packet by mouth daily. OTC. C4 Supplement    [provider]      Allergies    Patient has no known allergies.    Review of Systems   Review of Systems  Physical Exam Updated Vital Signs BP 127/84    Pulse 82    Temp 98.5 F (36.9 C) (Oral)    Resp 19    Ht 5' 1.5" (1.562 m)    Wt 82.1 kg    SpO2 98%    BMI 33.65 kg/m  Physical Exam Vitals and nursing note reviewed. Exam conducted with a chaperone present.  Constitutional:      Appearance: Normal appearance.  HENT:     Head: Normocephalic and atraumatic.  Eyes:     General: No scleral icterus.       Right eye: No discharge.        Left eye: No discharge.     Extraocular Movements: Extraocular movements intact.     Pupils: Pupils are equal, round, and reactive to light.  Cardiovascular:     Rate and Rhythm: Normal rate and regular rhythm.     Pulses: Normal pulses.     Heart sounds: Normal heart sounds. No murmur heard.   No friction rub. No gallop.  Pulmonary:     Effort: Pulmonary effort is normal. No respiratory distress.     Breath sounds: Normal breath  sounds.  Abdominal:     General: Abdomen is flat. Bowel sounds are normal. There is no distension.     Palpations: Abdomen is soft.     Tenderness: There is no abdominal tenderness.  Musculoskeletal:        General: Tenderness present. Normal range of motion.     Right lower leg: No edema.     Left lower leg: No edema.     Comments: Tenderness to the posterior calf and posterior to the knee.  Full ROM  Skin:    General: Skin is warm and dry.     Capillary Refill: Capillary refill takes less than 2 seconds.     Coloration: Skin is not jaundiced.     Findings: No erythema.  Neurological:     Mental Status: She is alert. Mental status is at baseline.     Coordination: Coordination normal.     Gait: Gait normal.     Comments: Steady gait    ED Results / Procedures / Treatments   Labs (all labs ordered are  listed, but only abnormal results are displayed) Labs Reviewed  BASIC METABOLIC PANEL - Abnormal; Notable for the following components:      Result Value   Potassium 3.4 (*)    Glucose, Bld 108 (*)    BUN 5 (*)    Calcium 8.4 (*)    All other components within normal limits  CBC WITH DIFFERENTIAL/PLATELET - Abnormal; Notable for the following components:   Hemoglobin 9.6 (*)    HCT 28.5 (*)    MCV 62.6 (*)    MCH 21.1 (*)    All other components within normal limits  D-DIMER, QUANTITATIVE  I-STAT BETA HCG BLOOD, ED ([facility], [facility], [facility] ONLY)    EKG None  Radiology No results found.  Procedures Procedures    Medications Ordered in ED Medications - No data to display  ED Course/ Medical Decision Making/ A&P                           Medical Decision Making Amount and/or Complexity of Data Reviewed External Data Reviewed: notes.    Details: I reviewed urgent care note from 03/31/2021.  It was documented that patient described her extremity pain as ongoing for multiple months at time of that visit.  Meaning this pain may have initiated prior to the surgery. Labs: ordered. ECG/medicine tests: ordered.   This patient presents to the ED for concern of lower extremity pain and swelling, this involves an extensive number of treatment options, and is a complaint that carries with it a high risk of complications and morbidity.  The differential diagnosis includes DVT, compartment syndrome, septic joint, cellulitis,    Patient's presentation is complicated by tobacco dependence and recent surgery/immobilization.  SDOH: patient does not have consistent primary care follow up   Additional history obtained: -External records from outside source obtained and reviewed including: Chart review including previous notes, labs, imaging, consultation notes   Lab Tests: -I ordered, reviewed, and interpreted labs.  The pertinent results include: CBC without leukocytosis.  Slightly decreased at  9.6, this is improved from most recent CBC from 10 years ago.  BMP does not show signs of AKI.  She is slightly hypokalemic at 3.4 and hypocalcemic at 8.4.  No prior for comparison.  I-STAT negative, not pregnant.  Dimer negative   Imaging Studies ordered: -I ordered imaging studies including [place] LE DVT study   Medicines ordered  and prescription drug management: -Patient offered medicine for pain, she declined stating she had oxycodone PTA -I have reviewed the patients home medicines and have made adjustments as needed    ED Course: 41 year old female presenting with right lower extremity pain and swelling.  .Per my chart review the actual onset is somewhat nebulous.  She has been having pain for months, but in the context of worsening symptoms in the context of recent surgery she does have risk for DVT.  Could be muscle strain, compartment syndrome considered but unlikely given soft compartments.  Additionally no trauma making occult fracture very unlikely.  Overlying skin exam does not show signs of cellulitis.  She is neurovascular intact on my exam, she has posterior calf tenderness but no entire leg swelling, calf swelling is not greater than 3 cm compared to other, no pitting edema or localized tenderness along the DVT system.  However, she is moderate risk for DVT based on Wells criteria.  Labs ordered, duplex study ordered.  Because ultrasound is not available until 7 AM patient is very anxious and supposed to take care of her daughter this morning. Does not have alternative child care arrangements avaiable.   We discussed ordering duplex study in the outpatient setting and having her return back this afternoon.  Patient is very anxious.patient is moderate risk, dimer considered and ultimately ordered after shared decision-making.  Although I personally think the duplex should be done to better evaluate for DVT, patient expressed interest in the test and desires to run it while waiting.   Explained that it is likely to be artificially elevated secondary to recent surgical procedure, patient verbalized understanding but still desires dimer.  Dimer ordered and resulted negative.   Discussed the results, although this is not 100% conclusive, I have very low suspicion that she has a DVT patient on the dimer result.  I did order an outpatient ultrasound she can do later today after taking care of her child.  At this time I do feel she is appropriate for discharge with close PCP follow-up   Cardiac Monitoring: The patient was maintained on a cardiac monitor.  I personally viewed and interpreted the cardiac monitored which showed an underlying rhythm of: NSR with HR 84.    Reevaluation: After the interventions noted above, I reevaluated the patient and found that they have :improved   Dispostion: D/C         Final Clinical Impression(s) / ED Diagnoses Final diagnoses:  Right leg pain    Rx / DC Orders ED Discharge Orders          Ordered    [facility]        04/06/21 0251              Theron Arista, PA-C 04/06/21 0634    Melene Plan, DO 04/06/21 2254

## 2021-04-16 DIAGNOSIS — I868 Varicose veins of other specified sites: Secondary | ICD-10-CM | POA: Diagnosis not present

## 2021-04-16 DIAGNOSIS — R609 Edema, unspecified: Secondary | ICD-10-CM | POA: Diagnosis not present

## 2021-04-27 DIAGNOSIS — M79605 Pain in left leg: Secondary | ICD-10-CM | POA: Diagnosis not present

## 2021-04-27 DIAGNOSIS — M79604 Pain in right leg: Secondary | ICD-10-CM | POA: Diagnosis not present

## 2021-04-27 DIAGNOSIS — R609 Edema, unspecified: Secondary | ICD-10-CM | POA: Diagnosis not present

## 2021-08-02 DIAGNOSIS — R21 Rash and other nonspecific skin eruption: Secondary | ICD-10-CM | POA: Diagnosis not present

## 2021-08-02 DIAGNOSIS — H6592 Unspecified nonsuppurative otitis media, left ear: Secondary | ICD-10-CM | POA: Diagnosis not present

## 2021-11-28 DIAGNOSIS — Z8709 Personal history of other diseases of the respiratory system: Secondary | ICD-10-CM | POA: Diagnosis not present

## 2021-11-28 DIAGNOSIS — J302 Other seasonal allergic rhinitis: Secondary | ICD-10-CM | POA: Diagnosis not present

## 2022-02-08 DIAGNOSIS — R609 Edema, unspecified: Secondary | ICD-10-CM | POA: Diagnosis not present

## 2022-02-08 DIAGNOSIS — L309 Dermatitis, unspecified: Secondary | ICD-10-CM | POA: Diagnosis not present

## 2022-02-08 DIAGNOSIS — M7751 Other enthesopathy of right foot: Secondary | ICD-10-CM | POA: Diagnosis not present

## 2022-03-23 DIAGNOSIS — Z136 Encounter for screening for cardiovascular disorders: Secondary | ICD-10-CM | POA: Diagnosis not present

## 2022-03-23 DIAGNOSIS — R5383 Other fatigue: Secondary | ICD-10-CM | POA: Diagnosis not present

## 2022-03-23 DIAGNOSIS — Z720 Tobacco use: Secondary | ICD-10-CM | POA: Diagnosis not present

## 2022-03-30 DIAGNOSIS — E781 Pure hyperglyceridemia: Secondary | ICD-10-CM | POA: Diagnosis not present

## 2022-03-30 DIAGNOSIS — Z0000 Encounter for general adult medical examination without abnormal findings: Secondary | ICD-10-CM | POA: Diagnosis not present

## 2022-03-30 DIAGNOSIS — R718 Other abnormality of red blood cells: Secondary | ICD-10-CM | POA: Diagnosis not present

## 2022-04-01 ENCOUNTER — Other Ambulatory Visit (HOSPITAL_BASED_OUTPATIENT_CLINIC_OR_DEPARTMENT_OTHER): Payer: Self-pay | Admitting: Student

## 2022-04-20 DIAGNOSIS — R718 Other abnormality of red blood cells: Secondary | ICD-10-CM | POA: Diagnosis not present

## 2022-04-20 DIAGNOSIS — E781 Pure hyperglyceridemia: Secondary | ICD-10-CM | POA: Diagnosis not present

## 2022-04-20 DIAGNOSIS — Z6835 Body mass index (BMI) 35.0-35.9, adult: Secondary | ICD-10-CM | POA: Diagnosis not present

## 2022-04-20 DIAGNOSIS — E669 Obesity, unspecified: Secondary | ICD-10-CM | POA: Diagnosis not present

## 2023-08-21 ENCOUNTER — Encounter (HOSPITAL_BASED_OUTPATIENT_CLINIC_OR_DEPARTMENT_OTHER): Payer: Self-pay | Admitting: Emergency Medicine

## 2023-08-21 ENCOUNTER — Emergency Department (HOSPITAL_BASED_OUTPATIENT_CLINIC_OR_DEPARTMENT_OTHER)
Admission: EM | Admit: 2023-08-21 | Discharge: 2023-08-21 | Disposition: A | Discharge status: Home / Self Care | Attending: Emergency Medicine | Admitting: Emergency Medicine

## 2023-08-21 ENCOUNTER — Emergency Department (HOSPITAL_BASED_OUTPATIENT_CLINIC_OR_DEPARTMENT_OTHER): Admitting: Radiology

## 2023-08-21 ENCOUNTER — Emergency Department (HOSPITAL_BASED_OUTPATIENT_CLINIC_OR_DEPARTMENT_OTHER)

## 2023-08-21 ENCOUNTER — Other Ambulatory Visit: Payer: Self-pay

## 2023-08-21 DIAGNOSIS — R0789 Other chest pain: Secondary | ICD-10-CM | POA: Diagnosis present

## 2023-08-21 DIAGNOSIS — F17200 Nicotine dependence, unspecified, uncomplicated: Secondary | ICD-10-CM | POA: Diagnosis not present

## 2023-08-21 DIAGNOSIS — M79604 Pain in right leg: Secondary | ICD-10-CM | POA: Diagnosis not present

## 2023-08-21 DIAGNOSIS — M79605 Pain in left leg: Secondary | ICD-10-CM | POA: Insufficient documentation

## 2023-08-21 LAB — TROPONIN T, HIGH SENSITIVITY: Troponin T High Sensitivity: <15 ng/L (ref <19)

## 2023-08-21 LAB — CBC
HCT: 38.2 % (ref 36.0–46.0)
Hemoglobin: 13 g/dL (ref 12.0–15.0)
MCH: 21.5 pg — ABNORMAL LOW (ref 26.0–34.0)
MCHC: 34 g/dL (ref 30.0–36.0)
MCV: 63.1 fL — ABNORMAL LOW (ref 80.0–100.0)
Platelets: 289 K/uL (ref 150–400)
RBC: 6.05 MIL/uL — ABNORMAL HIGH (ref 3.87–5.11)
RDW: 18.5 % — ABNORMAL HIGH (ref 11.5–15.5)
WBC: 10.2 K/uL (ref 4.0–10.5)
nRBC: 0.2 % (ref 0.0–0.2)

## 2023-08-21 LAB — BASIC METABOLIC PANEL WITH GFR
Anion gap: 13 (ref 5–15)
BUN: 14 mg/dL (ref 6–20)
CO2: 23 mmol/L (ref 22–32)
Calcium: 9.7 mg/dL (ref 8.9–10.3)
Chloride: 101 mmol/L (ref 98–111)
Creatinine, Ser: 0.91 mg/dL (ref 0.44–1.00)
GFR, Estimated: >60 mL/min (ref >60)
Glucose, Bld: 89 mg/dL (ref 70–99)
Potassium: 3.9 mmol/L (ref 3.5–5.1)
Sodium: 137 mmol/L (ref 135–145)

## 2023-08-21 LAB — PREGNANCY, URINE: Preg Test, Ur: NEGATIVE

## 2023-08-21 LAB — D-DIMER, QUANTITATIVE: D-Dimer, Quant: <0.27 ug{FEU}/mL (ref 0.00–0.50)

## 2023-08-21 NOTE — Discharge Instructions (Signed)
 Make an appointment to follow-up with your doctor.  Would recommend Motrin  800 mg every 8 hours.  And extra strength Tylenol 2 tablets every 8 hours.  Today's workup negative for any acute heart problem also no evidence of any blood clots.  And chest x-ray was normal.  EKG without any acute findings.

## 2023-08-21 NOTE — ED Notes (Signed)
 Given UA cup for lobby sample.

## 2023-08-21 NOTE — ED Notes (Signed)
 Pt aware of the need for a urine... Unable to currently provide a sample.Jodi KitchenMarland Blair

## 2023-08-21 NOTE — ED Provider Notes (Signed)
 [place] EMERGENCY DEPARTMENT AT [facility] Provider Note   CSN: [id number] Arrival date & time: 08/21/23  1734     Patient presents with: No chief complaint on file.   Jodi Blair is a 43 y.o. female.   Patient with a complaint of bilateral leg pain for 2 days associated with some shortness of breath and chest pain.  Patient is a current smoker drinks alcohol almost daily.  No injury to the legs.  Patient states that they kind of achy all over nonstop.  Patient does have a primary care provider.  Past medical history noncontributory.  Past surgical history noncontributory patient's chest pain though was more just a strange feeling in the chest.  Not any kind of classic chest pain.  Patient was seen in the emergency department in February 2023 for right leg pain.  Patient has been taking Motrin  at home for the pain.       Prior to Admission medications   Medication Sig Start Date End Date Taking? Authorizing Provider  cetirizine (ZYRTEC) 10 MG chewable tablet Chew 10 mg by mouth daily.    [provider]  CREATINE PO Take 1 tablet by mouth daily. Reported on 07/04/2015    [provider]  diphenhydrAMINE (BENADRYL) 25 mg capsule Take 25 mg by mouth every 6 (six) hours as needed.    [provider]  ibuprofen  (ADVIL ,MOTRIN ) 200 MG tablet Take 800 mg by mouth every 6 (six) hours as needed for pain.    [provider]  ibuprofen  (ADVIL ,MOTRIN ) 800 MG tablet Take 1 tablet (800 mg total) by mouth 3 (three) times daily. Patient not taking: Reported on 07/04/2015 08/05/12   Sheran Rogue, MD  Multiple Vitamin (MULTIVITAMIN WITH MINERALS) TABS Take 1 tablet by mouth daily.    [provider]  naproxen  (NAPROSYN ) 250 MG tablet Take 1 tablet (250 mg total) by mouth 2 (two) times daily with a meal. 06/27/16   Oxford, Elsie PARAS, FNP  NON FORMULARY Take 1 packet by mouth daily. OTC. C4 Supplement    [provider]    Allergies: Patient  has no known allergies.    Review of Systems  Constitutional:  Negative for chills and fever.  HENT:  Negative for ear pain and sore throat.   Eyes:  Negative for pain and visual disturbance.  Respiratory:  Positive for shortness of breath. Negative for cough.   Cardiovascular:  Positive for chest pain. Negative for palpitations.  Gastrointestinal:  Negative for abdominal pain and vomiting.  Genitourinary:  Negative for dysuria and hematuria.  Musculoskeletal:  Positive for myalgias. Negative for arthralgias and back pain.  Skin:  Negative for color change and rash.  Neurological:  Negative for seizures and syncope.  All other systems reviewed and are negative.   Updated Vital Signs BP 139/74 (BP Location: Right Arm)   Pulse 73   Temp 98.5 F (36.9 C)   Resp 17   Ht 1.575 m (5' 2)   Wt 86.2 kg   SpO2 100%   BMI 34.75 kg/m   Physical Exam Vitals and nursing note reviewed.  Constitutional:      General: She is not in acute distress.    Appearance: Normal appearance. She is well-developed.  HENT:     Head: Normocephalic and atraumatic.     Mouth/Throat:     Mouth: Mucous membranes are moist.  Eyes:     Conjunctiva/sclera: Conjunctivae normal.     Pupils: Pupils are equal, round, and reactive to  light.  Cardiovascular:     Rate and Rhythm: Normal rate and regular rhythm.     Heart sounds: No murmur heard. Pulmonary:     Effort: Pulmonary effort is normal. No respiratory distress.     Breath sounds: Normal breath sounds. No wheezing, rhonchi or rales.  Abdominal:     Palpations: Abdomen is soft.     Tenderness: There is no abdominal tenderness.  Musculoskeletal:        General: No swelling.     Cervical back: Normal range of motion and neck supple.     Right lower leg: No edema.     Left lower leg: No edema.     Comments: No erythema to lower extremities.  No edema to the ankles or feet.  Dorsalis pedis pulses 1-2+ bilaterally.  No knee swelling.  Skin:    General:  Skin is warm and dry.     Capillary Refill: Capillary refill takes less than 2 seconds.  Neurological:     General: No focal deficit present.     Mental Status: She is alert and oriented to person, place, and time.  Psychiatric:        Mood and Affect: Mood normal.     (all labs ordered are listed, but only abnormal results are displayed) Labs Reviewed  BASIC METABOLIC PANEL WITH GFR  CBC  PREGNANCY, URINE  D-DIMER, QUANTITATIVE  TROPONIN T, HIGH SENSITIVITY    EKG: EKG Interpretation Date/Time:  Thursday August 21 2023 17:50:45 EDT Ventricular Rate:  69 PR Interval:  141 QRS Duration:  91 QT Interval:  387 QTC Calculation: 415 R Axis:   90  Text Interpretation: Sinus rhythm Borderline right axis deviation No significant change since last tracing Confirmed by Ivett Luebbe [phone]) on 08/21/2023 5:55:05 PM  Radiology: No results found.   Procedures   Medications Ordered in the ED - No data to display                                  Medical Decision Making Amount and/or Complexity of Data Reviewed Labs: ordered. Radiology: ordered.   His workup here Doppler studies to both lower extremities without evidence of any clot.  Patient did have a D-dimer pending which is now back which was also normal negative so that kind of rules out any concerns for PE as well.  Dopplers were done because we were concerned that maybe ultrasound would not be here by the time the D-dimer came back.  Patient metabolic panel is normal CBC is normal troponin less than 15 does not need a delta troponin because symptoms been ongoing for couple days.  And her chest pain symptoms are very atypical.  EKG without any acute changes.  And chest x-ray without any acute findings.  Patient stable for discharge home would recommend Motrin  800 mg every 8 hours Exer strength Tylenol 2 tablets every 8 hours and follow back up with her primary care doctor.  Final diagnoses:  None    ED Discharge Orders      None          Julea Hutto, MD 08/22/23 [phone]

## 2023-08-21 NOTE — ED Triage Notes (Signed)
 Reports bilateral leg soreness and calf pain. Lipo suction on legs last year. Sits at work most of the day. 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 Pulses equal- dorsal pedal. Basic labs drawn at PCP 2 days. Endorses SOB and CP today- current smoker- EtOH almost daily.

## 2024-01-02 ENCOUNTER — Other Ambulatory Visit: Payer: Self-pay

## 2024-01-02 ENCOUNTER — Emergency Department (HOSPITAL_BASED_OUTPATIENT_CLINIC_OR_DEPARTMENT_OTHER): Admitting: Radiology

## 2024-01-02 ENCOUNTER — Emergency Department (HOSPITAL_BASED_OUTPATIENT_CLINIC_OR_DEPARTMENT_OTHER)
Admission: EM | Admit: 2024-01-02 | Discharge: 2024-01-02 | Disposition: A | Discharge status: Home / Self Care | Attending: Emergency Medicine | Admitting: Emergency Medicine

## 2024-01-02 DIAGNOSIS — M62838 Other muscle spasm: Secondary | ICD-10-CM | POA: Diagnosis not present

## 2024-01-02 DIAGNOSIS — R0789 Other chest pain: Secondary | ICD-10-CM | POA: Diagnosis present

## 2024-01-02 LAB — BASIC METABOLIC PANEL WITH GFR
Anion gap: 14 (ref 5–15)
BUN: 12 mg/dL (ref 6–20)
CO2: 24 mmol/L (ref 22–32)
Calcium: 9.8 mg/dL (ref 8.9–10.3)
Chloride: 101 mmol/L (ref 98–111)
Creatinine, Ser: 0.98 mg/dL (ref 0.44–1.00)
GFR, Estimated: >60 mL/min (ref >60)
Glucose, Bld: 89 mg/dL (ref 70–99)
Potassium: 3.8 mmol/L (ref 3.5–5.1)
Sodium: 139 mmol/L (ref 135–145)

## 2024-01-02 LAB — CBC
HCT: 37 % (ref 36.0–46.0)
Hemoglobin: 12.6 g/dL (ref 12.0–15.0)
MCH: 20.9 pg — ABNORMAL LOW (ref 26.0–34.0)
MCHC: 34.1 g/dL (ref 30.0–36.0)
MCV: 61.4 fL — ABNORMAL LOW (ref 80.0–100.0)
Platelets: 333 K/uL (ref 150–400)
RBC: 6.03 MIL/uL — ABNORMAL HIGH (ref 3.87–5.11)
RDW: 18.2 % — ABNORMAL HIGH (ref 11.5–15.5)
WBC: 9.5 K/uL (ref 4.0–10.5)
nRBC: 0 % (ref 0.0–0.2)

## 2024-01-02 LAB — D-DIMER, QUANTITATIVE: D-Dimer, Quant: 0.28 ug{FEU}/mL (ref 0.00–0.50)

## 2024-01-02 LAB — TROPONIN T, HIGH SENSITIVITY: Troponin T High Sensitivity: <15 ng/L (ref 0–19)

## 2024-01-02 LAB — PREGNANCY, URINE: Preg Test, Ur: NEGATIVE

## 2024-01-02 MED ORDER — METHOCARBAMOL 500 MG PO TABS: 500.0000 mg | ORAL_TABLET | Freq: Two times a day (BID) | ORAL | 0 refills | Status: AC

## 2024-01-02 MED ORDER — KETOROLAC TROMETHAMINE 15 MG/ML IJ SOLN
15.0000 mg | Freq: Once | INTRAMUSCULAR | Status: AC
  Administered 2024-01-02: 15 mg via INTRAVENOUS
  Filled 2024-01-02: qty 1

## 2024-01-02 NOTE — ED Notes (Signed)
 Patient transported to X-ray

## 2024-01-02 NOTE — ED Provider Notes (Signed)
 [place] EMERGENCY DEPARTMENT AT [facility] Provider Note   CSN: [id number] Arrival date & time: 01/02/24  8161     Patient presents with: Chest Pain   Jodi Blair is a 43 y.o. female with overall noncontributory past medical history who presents with concern for left-sided chest pain with some radiation to back.  She endorses some tingling in her left arm which has since resolved.  Denies any shortness of breath, nausea, vomiting, fever, chills, dysuria.  Reports that she was sitting down when it started last night.  Denies worse with exertion.  She reports it feels like squeezing/muscle spasm.  Denies any known injury, recent workout, or lifting that she knows of.  No previous history of similar.  No recent travel, no recent surgery.  No history of blood clots.    Chest Pain      Prior to Admission medications   Medication Sig Start Date End Date Taking? Authorizing Provider  methocarbamol  (ROBAXIN ) 500 MG tablet Take 1 tablet (500 mg total) by mouth 2 (two) times daily. 01/02/24  Yes Chyler Creely H, PA-C  cetirizine (ZYRTEC) 10 MG chewable tablet Chew 10 mg by mouth daily.    [provider]  CREATINE PO Take 1 tablet by mouth daily. Reported on 07/04/2015    [provider]  diphenhydrAMINE (BENADRYL) 25 mg capsule Take 25 mg by mouth every 6 (six) hours as needed.    [provider]  ibuprofen  (ADVIL ,MOTRIN ) 200 MG tablet Take 800 mg by mouth every 6 (six) hours as needed for pain.    [provider]  ibuprofen  (ADVIL ,MOTRIN ) 800 MG tablet Take 1 tablet (800 mg total) by mouth 3 (three) times daily. Patient not taking: Reported on 07/04/2015 08/05/12   Sheran Rogue, MD  Multiple Vitamin (MULTIVITAMIN WITH MINERALS) TABS Take 1 tablet by mouth daily.    [provider]  naproxen  (NAPROSYN ) 250 MG tablet Take 1 tablet (250 mg total) by mouth 2 (two) times daily with a meal. 06/27/16   Oxford, Elsie PARAS, FNP  NON FORMULARY  Take 1 packet by mouth daily. OTC. C4 Supplement    [provider]    Allergies: Patient has no known allergies.    Review of Systems  Cardiovascular:  Positive for chest pain.  All other systems reviewed and are negative.   Updated Vital Signs BP 136/75   Pulse 94   Temp 98 F (36.7 C)   Resp (!) 22   Ht 5' 2 (1.575 m)   Wt 86.2 kg   LMP 12/27/2023 (Approximate)   SpO2 100%   BMI 34.75 kg/m   Physical Exam Vitals and nursing note reviewed.  Constitutional:      General: She is not in acute distress.    Appearance: Normal appearance.  HENT:     Head: Normocephalic and atraumatic.  Eyes:     General:        Right eye: No discharge.        Left eye: No discharge.  Cardiovascular:     Rate and Rhythm: Normal rate and regular rhythm.     Heart sounds: No murmur heard.    No friction rub. No gallop.  Pulmonary:     Effort: Pulmonary effort is normal.     Breath sounds: Normal breath sounds.  Chest:     Comments: Mild to palpation to left upper chest wall, reproducible, left lateral rib cage reproducible Abdominal:     General: Bowel sounds are normal.  Palpations: Abdomen is soft.  Skin:    General: Skin is warm and dry.     Capillary Refill: Capillary refill takes less than 2 seconds.  Neurological:     Mental Status: She is alert and oriented to person, place, and time.  Psychiatric:        Mood and Affect: Mood normal.        Behavior: Behavior normal.     (all labs ordered are listed, but only abnormal results are displayed) Labs Reviewed  CBC - Abnormal; Notable for the following components:      Result Value   RBC 6.03 (*)    MCV 61.4 (*)    MCH 20.9 (*)    RDW 18.2 (*)    All other components within normal limits  BASIC METABOLIC PANEL WITH GFR  PREGNANCY, URINE  D-DIMER, QUANTITATIVE  TROPONIN T, HIGH SENSITIVITY    EKG: None  Radiology: DG Chest 2 View Result Date: 01/02/2024 EXAM: 2 VIEW(S) XRAY OF THE CHEST 01/02/2024  07:06:00 PM COMPARISON: 08/21/2023 CLINICAL HISTORY: chest pain FINDINGS: LUNGS AND PLEURA: No focal pulmonary opacity. No pleural effusion. No pneumothorax. HEART AND MEDIASTINUM: No acute abnormality of the cardiac and mediastinal silhouettes. BONES AND SOFT TISSUES: No acute osseous abnormality. IMPRESSION: 1. No acute cardiopulmonary process. Electronically signed by: Franky Crease MD 01/02/2024 07:15 PM EST RP Workstation: [id number]     Procedures   Medications Ordered in the ED  ketorolac  (TORADOL ) 15 MG/ML injection 15 mg (15 mg Intravenous Given 01/02/24 1942)                                    Medical Decision Making Amount and/or Complexity of Data Reviewed Labs: ordered. Radiology: ordered.  Risk Prescription drug management.   This patient is a 43 y.o. female  who presents to the ED for concern of chest pain.   Differential diagnoses prior to evaluation: The emergent differential diagnosis includes, but is not limited to,  ACS, AAS, PE, Mallory-Weiss, Boerhaave's, Pneumonia, acute bronchitis, asthma or COPD exacerbation, anxiety, MSK pain or traumatic injury to the chest, acid reflux versus other . This is not an exhaustive differential.   Past Medical History / Co-morbidities / Social History: Overall noncontributory  Physical Exam: Physical exam performed. The pertinent findings include: Borderline tachycardia, tachypnea on arrival although tachycardia resolves with rest.  She does have some mild tenderness to palpation of the left upper chest wall which is reproducible, with radiation towards left serratus chain, and left thoracic paraspinous muscles.  No midline spinal tenderness.  Intact strength 5/5 of bilateral upper and lower extremities.  Lab Tests/Imaging studies: I personally interpreted labs/imaging and the pertinent results include: CBC unremarkable, negative serum pregnancy test, independently turbid plain film chest x-ray which shows no evidence of acute  intrathoracic abnormality.  Troponin shows normal at less than 15, D-dimer is negative.  I independently interpreted plain film chest x-ray which shows no evidence of acute intrathoracic abnormality. I agree with the radiologist interpretation.  Cardiac monitoring: EKG obtained and interpreted by myself and attending physician which shows: Normal sinus rhythm, no acute ST-T changes   Medications: I ordered medication including Toradol  for pain.  I have reviewed the patients home medicines and have made adjustments as needed.   Disposition: After consideration of the diagnostic results and the patients response to treatment, I feel that patient stable for discharge, unclear etiology for chest pain, but  suspicion based on her description is a muscle spasm of the serratus chain/thoracic back muscles radiating towards the anterior chest wall, also consider costochondritis, atypical GERD presentation, will trial ibuprofen , Tylenol, muscle relaxant, extensive return precautions given.   emergency department workup does not suggest an emergent condition requiring admission or immediate intervention beyond what has been performed at this time. The plan is: as above. The patient is safe for discharge and has been instructed to return immediately for worsening symptoms, change in symptoms or any other concerns.  Final diagnoses:  Atypical chest pain  Muscle spasm    ED Discharge Orders          Ordered    methocarbamol  (ROBAXIN ) 500 MG tablet  2 times daily        01/02/24 2104               Rosan Sherlean DEL, PA-C 01/02/24 2104    Pamella Ozell LABOR, DO 01/06/24 2344

## 2024-01-02 NOTE — Discharge Instructions (Addendum)

## 2024-01-02 NOTE — ED Triage Notes (Signed)
 Pt POV reporting intermittent L side chest pain that radiates to back, also reporting intermittent L arm numbness, subsided at this time.
